# Patient Record
Sex: Male | Born: 1955 | Race: Black or African American | Hispanic: No | Marital: Married | Smoking: Never smoker
Health system: Southern US, Community
[De-identification: ages and names within clinical notes are randomized; demographics above are authoritative.]

## PROBLEM LIST (undated history)

## (undated) DIAGNOSIS — R5382 Chronic fatigue, unspecified: Secondary | ICD-10-CM

## (undated) DIAGNOSIS — D179 Benign lipomatous neoplasm, unspecified: Secondary | ICD-10-CM

## (undated) DIAGNOSIS — R030 Elevated blood-pressure reading, without diagnosis of hypertension: Secondary | ICD-10-CM

## (undated) DIAGNOSIS — K219 Gastro-esophageal reflux disease without esophagitis: Secondary | ICD-10-CM

## (undated) DIAGNOSIS — I10 Essential (primary) hypertension: Secondary | ICD-10-CM

## (undated) HISTORY — DX: Elevated blood-pressure reading, without diagnosis of hypertension: R03.0

## (undated) HISTORY — DX: Chronic fatigue, unspecified: R53.82

## (undated) HISTORY — DX: Gastro-esophageal reflux disease without esophagitis: K21.9

## (undated) HISTORY — DX: Essential (primary) hypertension: I10

## (undated) HISTORY — PX: NO PAST SURGERIES: SHX2092

## (undated) HISTORY — DX: Benign lipomatous neoplasm, unspecified: D17.9

---

## 2004-07-26 ENCOUNTER — Ambulatory Visit: Payer: Self-pay

## 2005-02-02 ENCOUNTER — Emergency Department: Payer: Self-pay | Admitting: General Practice

## 2006-01-31 ENCOUNTER — Ambulatory Visit: Payer: Self-pay | Admitting: Family Medicine

## 2007-03-04 DIAGNOSIS — G9332 Myalgic encephalomyelitis/chronic fatigue syndrome: Secondary | ICD-10-CM

## 2007-03-04 DIAGNOSIS — R5382 Chronic fatigue, unspecified: Secondary | ICD-10-CM

## 2007-03-04 HISTORY — DX: Chronic fatigue, unspecified: R53.82

## 2007-03-04 HISTORY — DX: Myalgic encephalomyelitis/chronic fatigue syndrome: G93.32

## 2007-03-24 DIAGNOSIS — M542 Cervicalgia: Secondary | ICD-10-CM | POA: Insufficient documentation

## 2007-03-28 ENCOUNTER — Ambulatory Visit: Payer: Self-pay | Admitting: Family Medicine

## 2007-06-18 DIAGNOSIS — D179 Benign lipomatous neoplasm, unspecified: Secondary | ICD-10-CM

## 2007-06-18 HISTORY — DX: Benign lipomatous neoplasm, unspecified: D17.9

## 2009-07-27 ENCOUNTER — Ambulatory Visit: Payer: Self-pay | Admitting: Family Medicine

## 2009-07-27 DIAGNOSIS — IMO0001 Reserved for inherently not codable concepts without codable children: Secondary | ICD-10-CM

## 2009-07-27 HISTORY — DX: Reserved for inherently not codable concepts without codable children: IMO0001

## 2010-04-04 ENCOUNTER — Ambulatory Visit: Payer: Self-pay | Admitting: Family Medicine

## 2010-08-23 ENCOUNTER — Ambulatory Visit: Payer: Self-pay | Admitting: Family Medicine

## 2014-09-25 ENCOUNTER — Ambulatory Visit: Payer: Self-pay | Admitting: Family Medicine

## 2015-02-07 ENCOUNTER — Ambulatory Visit: Payer: Self-pay | Admitting: Family Medicine

## 2015-03-21 ENCOUNTER — Ambulatory Visit (INDEPENDENT_AMBULATORY_CARE_PROVIDER_SITE_OTHER): Payer: 59 | Admitting: Family Medicine

## 2015-03-21 ENCOUNTER — Encounter (INDEPENDENT_AMBULATORY_CARE_PROVIDER_SITE_OTHER): Payer: Self-pay

## 2015-03-21 ENCOUNTER — Encounter: Payer: Self-pay | Admitting: Family Medicine

## 2015-03-21 VITALS — BP 130/84 | HR 93 | Temp 98.1°F | Resp 18 | Ht 69.0 in | Wt 203.1 lb

## 2015-03-21 DIAGNOSIS — M542 Cervicalgia: Secondary | ICD-10-CM | POA: Diagnosis not present

## 2015-03-21 DIAGNOSIS — M12811 Other specific arthropathies, not elsewhere classified, right shoulder: Secondary | ICD-10-CM | POA: Diagnosis not present

## 2015-03-21 DIAGNOSIS — F32A Depression, unspecified: Secondary | ICD-10-CM | POA: Insufficient documentation

## 2015-03-21 DIAGNOSIS — R519 Headache, unspecified: Secondary | ICD-10-CM

## 2015-03-21 DIAGNOSIS — D179 Benign lipomatous neoplasm, unspecified: Secondary | ICD-10-CM | POA: Diagnosis not present

## 2015-03-21 DIAGNOSIS — R42 Dizziness and giddiness: Secondary | ICD-10-CM | POA: Diagnosis not present

## 2015-03-21 DIAGNOSIS — F329 Major depressive disorder, single episode, unspecified: Secondary | ICD-10-CM | POA: Insufficient documentation

## 2015-03-21 DIAGNOSIS — M75101 Unspecified rotator cuff tear or rupture of right shoulder, not specified as traumatic: Secondary | ICD-10-CM

## 2015-03-21 DIAGNOSIS — G894 Chronic pain syndrome: Secondary | ICD-10-CM

## 2015-03-21 DIAGNOSIS — J309 Allergic rhinitis, unspecified: Secondary | ICD-10-CM | POA: Insufficient documentation

## 2015-03-21 DIAGNOSIS — R51 Headache: Secondary | ICD-10-CM | POA: Diagnosis not present

## 2015-03-21 DIAGNOSIS — R030 Elevated blood-pressure reading, without diagnosis of hypertension: Secondary | ICD-10-CM

## 2015-03-21 DIAGNOSIS — I10 Essential (primary) hypertension: Secondary | ICD-10-CM | POA: Insufficient documentation

## 2015-03-21 DIAGNOSIS — R5383 Other fatigue: Secondary | ICD-10-CM | POA: Insufficient documentation

## 2015-03-21 DIAGNOSIS — K219 Gastro-esophageal reflux disease without esophagitis: Secondary | ICD-10-CM | POA: Insufficient documentation

## 2015-03-21 MED ORDER — CYCLOBENZAPRINE HCL 5 MG PO TABS: 5.0000 mg | ORAL_TABLET | Freq: Three times a day (TID) | ORAL | Status: DC | PRN

## 2015-03-21 MED ORDER — PREGABALIN 75 MG PO CAPS: 75.0000 mg | ORAL_CAPSULE | Freq: Two times a day (BID) | ORAL | Status: DC

## 2015-03-21 NOTE — Progress Notes (Signed)
Name: Dylan Sloan   MRN: [id number]    DOB: [birth year]   Date:03/21/2015       Progress Note  Subjective  Chief Complaint  Chief Complaint  Patient presents with  . Shoulder Injury     right, was having a headache, got dizzy and fell. Surgery scheduled for 03/28/2015 in [place]  . 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 The right shoulder is affected. The incident occurred more than 1 week ago. The quality of the pain is described as aching and stabbing. The pain radiates to the right arm and right neck. The pain is moderate. Pertinent negatives include no chest pain or tingling. The symptoms are aggravated by movement and overhead lifting. He has tried acetaminophen, elevation, immobilization and NSAIDs for the symptoms. The treatment provided mild relief.  Headache  This is a chronic problem. The current episode started more than 1 year ago. The problem occurs constantly. The problem has been waxing and waning. The pain is located in the bilateral region. The pain radiates to the left shoulder and right shoulder. The pain quality is similar to prior headaches. The quality of the pain is described as throbbing and stabbing. The pain is severe. Associated symptoms include anorexia, dizziness, insomnia, a loss of balance, neck pain and weakness. Pertinent negatives include no back pain, blurred vision, coughing, eye redness, fever, hearing loss, nausea, seizures, sore throat, tingling, tinnitus, vomiting or weight loss. Associated symptoms comments: Syncopal episodes. The symptoms are aggravated by activity, fatigue, hunger, noise and weather changes. He has tried acetaminophen, antidepressants and NSAIDs for the symptoms. The treatment provided mild relief. His past medical history is significant for obesity.  (Elevated blood pressure without diagnosis of hypertension)   CNS lipoma  Patient is had a repeat MRI of the skull was recently 6 months ago. It is show no change in the size of the lipoma and there is no compression from this lesion beyond that was seen in 2008.  Elevated blood pressure without hypertension  Patient is had recurrent episodes of elevation of his blood pressure. On his last visit here he was experiencing pain was in the 671 range systolic. He states that on his emergency room visit [place] where he injured his shoulder his blood pressures in the 150 range. He is not expressing any chest pain or palpitations.   Past Medical History  Diagnosis Date  . GERD (gastroesophageal reflux disease)   . Elevated blood pressure 07/27/2009  . Lipoma 06/18/2007  . Chronic fatigue syndrome 03/04/2007    History  Substance Use Topics  . Smoking status: Never Smoker   . Smokeless tobacco: Never Used  . Alcohol Use: No     Current outpatient prescriptions:  .  traMADol (ULTRAM) 50 MG tablet, Take 50 mg by mouth every 6 (six) hours as needed., Disp: , Rfl:  .  cyclobenzaprine (FLEXERIL) 5 MG tablet, Take 5 mg by mouth 3 (three) times daily as needed for muscle spasms., Disp: , Rfl:  .  diclofenac (VOLTAREN) 75 MG EC tablet, Take 75 mg by mouth 2 (two) times daily., Disp: , Rfl:  .  pregabalin (LYRICA) 75 MG capsule, Take 75 mg by mouth 2 (two) times daily., Disp: , Rfl:   Allergies  Allergen Reactions  . Demerol [Meperidine] Other (See Comments)  Review of Systems  Constitutional: Negative for fever, chills and weight loss.  HENT: Negative for congestion, hearing loss, sore throat and tinnitus.   Eyes: Negative for blurred vision, double vision and redness.  Respiratory: Negative for cough, hemoptysis and shortness of breath.   Cardiovascular: Negative for chest pain, palpitations, orthopnea, claudication and leg swelling.  Gastrointestinal: Positive for anorexia. Negative  for heartburn, nausea, vomiting, diarrhea, constipation and blood in stool.  Genitourinary: Negative for dysuria, urgency, frequency and hematuria.  Musculoskeletal: Positive for joint pain and neck pain. Negative for myalgias, back pain and falls.       Right shoulder pain has rotator cuff tear from fall  Skin: Negative for itching.  Neurological: Positive for dizziness, sensory change, weakness, headaches and loss of balance. Negative for tingling, tremors, focal weakness, seizures and loss of consciousness.  Endo/Heme/Allergies: Does not bruise/bleed easily.  Psychiatric/Behavioral: Negative for depression and substance abuse. The patient has insomnia. The patient is not nervous/anxious.      Objective  Filed Vitals:   03/21/15 1408  BP: 130/84  Pulse: 93  Temp: 98.1 F (36.7 C)  TempSrc: Oral  Resp: 18  Height: 5\' 9"  (1.753 m)  Weight: 203 lb 1.6 oz (92.126 kg)  SpO2: 97%     Physical Exam  Constitutional: He is oriented to person, place, and time and well-developed, well-nourished, and in no distress.  HENT:  Head: Normocephalic.  Eyes: EOM are normal. Pupils are equal, round, and reactive to light.  Neck: Normal range of motion. Neck supple. No thyromegaly present.  Cardiovascular: Normal rate, regular rhythm and normal heart sounds.   No murmur heard. Pulmonary/Chest: Effort normal and breath sounds normal. No respiratory distress. He has no wheezes.  Abdominal: Soft. Bowel sounds are normal.  Musculoskeletal: He exhibits tenderness. He exhibits no edema.  Right shoulder markedly tender and he is unable to abduct beyond 90.  Lymphadenopathy:    He has no cervical adenopathy.  Neurological: He is alert and oriented to person, place, and time. No cranial nerve deficit. Gait normal. Coordination normal.  Skin: Skin is warm and dry. No rash noted.  Psychiatric: Mood, memory, affect and judgment normal.      Assessment & Plan  1. Intractable episodic headache,  unspecified headache type Relatively stable  2. Blood pressure elevated without history of HTN Currently controlled. DASH diet - EKG 12-Lead within normal limits  3. Rotator cuff tear arthropathy of right shoulder No contraindication to cervical repair at this time  4. Cervical pain Stable - cyclobenzaprine (FLEXERIL) 5 MG tablet; Take 1 tablet (5 mg total) by mouth 3 (three) times daily as needed for muscle spasms.  Dispense: 90 tablet; Refill: 5 - pregabalin (LYRICA) 75 MG capsule; Take 1 capsule (75 mg total) by mouth 2 (two) times daily.  Dispense: 60 capsule; Refill: 5  5. Dizziness and giddiness Stable intermittent exacerbation  6. Lipoma Clinically stable by MRI - pregabalin (LYRICA) 75 MG capsule; Take 1 capsule (75 mg total) by mouth 2 (two) times daily.  Dispense: 60 capsule; Refill: 5

## 2015-03-21 NOTE — Patient Instructions (Signed)

## 2015-09-21 ENCOUNTER — Ambulatory Visit (INDEPENDENT_AMBULATORY_CARE_PROVIDER_SITE_OTHER): Payer: 59 | Admitting: Family Medicine

## 2015-09-21 ENCOUNTER — Encounter: Payer: Self-pay | Admitting: Family Medicine

## 2015-09-21 VITALS — BP 142/88 | HR 98 | Temp 98.8°F | Resp 16 | Wt 212.6 lb

## 2015-09-21 DIAGNOSIS — Z23 Encounter for immunization: Secondary | ICD-10-CM | POA: Diagnosis not present

## 2015-09-21 DIAGNOSIS — Q040 Congenital malformations of corpus callosum: Secondary | ICD-10-CM | POA: Diagnosis not present

## 2015-09-21 DIAGNOSIS — R55 Syncope and collapse: Secondary | ICD-10-CM

## 2015-09-21 DIAGNOSIS — M542 Cervicalgia: Secondary | ICD-10-CM | POA: Diagnosis not present

## 2015-09-21 DIAGNOSIS — D1779 Benign lipomatous neoplasm of other sites: Secondary | ICD-10-CM

## 2015-09-21 DIAGNOSIS — I10 Essential (primary) hypertension: Secondary | ICD-10-CM

## 2015-09-21 MED ORDER — DICLOFENAC SODIUM 75 MG PO TBEC: 75.0000 mg | DELAYED_RELEASE_TABLET | Freq: Two times a day (BID) | ORAL | Status: AC

## 2015-09-21 MED ORDER — TRAMADOL HCL 50 MG PO TABS: 50.0000 mg | ORAL_TABLET | Freq: Four times a day (QID) | ORAL | Status: AC | PRN

## 2015-09-21 MED ORDER — HYDROCHLOROTHIAZIDE 12.5 MG PO TABS: 12.5000 mg | ORAL_TABLET | Freq: Every day | ORAL | Status: AC

## 2015-09-21 MED ORDER — PREGABALIN 75 MG PO CAPS: 75.0000 mg | ORAL_CAPSULE | Freq: Two times a day (BID) | ORAL | Status: DC

## 2015-09-21 MED ORDER — CYCLOBENZAPRINE HCL 5 MG PO TABS: 5.0000 mg | ORAL_TABLET | Freq: Every day | ORAL | Status: AC

## 2015-09-21 NOTE — Progress Notes (Signed)
Name: Dylan Sloan   MRN: [id number]    DOB: 1956-07-11   Date:09/21/2015       Progress Note  Subjective  Chief Complaint  Chief Complaint  Patient presents with  . Headache    patient is here for his 82-month follow-up. he stated that he still have dizziness and headaches.   . Lymphoma    is being monitored. had a MRI about 6 months ago.  . 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 He was diagnosed with a lipoma in his brain and also corpus callosum abnormality. He has on disability since. Last MRI was done one year ago. He has recurrent episodes of syncope and last episode was about one month ago ( he recalls what he saw prior to syncope, but unable to remember anything for about 10 minutes , no bowel or bladder incontinence with episode ). He has not seen his neurologist recently. He continues to take Lyrica, Voltaren and Flexeril daily . No side effects of medication. He continues to have daily nuchal pain, pain at this time is 4/10, occasionally has tingling on both hands. No weakness. He has daily headaches.   HTN: not checking bp, his bp has been elevated twice in our office. No chest pain, no palpitation.   Patient Active Problem List   Diagnosis Date Noted  . Allergic rhinitis 03/21/2015  . Clinical depression 03/21/2015  . Fatigue 03/21/2015  . Acid reflux 03/21/2015  . Hypertension, benign 03/21/2015  . Chronic pain syndrome 03/21/2015  . Rotator cuff tear arthropathy of right shoulder 03/21/2015  . 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 Dizziness and giddiness 07/17/2007  . Lipoma 06/18/2007  . Cervical pain 03/24/2007  . Cephalalgia 03/24/2007    Past Surgical History  Procedure Laterality Date  . No past surgeries      Family History  Problem Relation Age of Onset  . Breast cancer Mother     Social History   Social History  . Marital Status: Married     Spouse Name: N/A  . Number of Children: N/A  . Years of Education: N/A   Occupational History  . Not on file.   Social History Main Topics  . Smoking status: Never Smoker   . Smokeless tobacco: Never Used  . Alcohol Use: No  . Drug Use: No  . Sexual Activity:    Partners: Female   Other Topics Concern  . Not on file   Social History Narrative     Current outpatient prescriptions:  .  cyclobenzaprine (FLEXERIL) 5 MG tablet, Take 1 tablet (5 mg total) by mouth at bedtime., Disp: 90 tablet, Rfl: 1 .  diclofenac (VOLTAREN) 75 MG EC tablet, Take 1 tablet (75 mg total) by mouth 2 (two) times daily., Disp: 180 tablet, Rfl: 0 .  pregabalin (LYRICA) 75 MG capsule, Take 1 capsule (75 mg total) by mouth 2 (two) times daily., Disp: 180 capsule, Rfl: 1 .  traMADol (ULTRAM) 50 MG tablet, Take 1 tablet (50 mg total) by mouth every 6 (six) hours as needed., Disp: 90 tablet, Rfl: 0 .  hydrochlorothiazide (HYDRODIURIL) 12.5 MG tablet, Take 1 tablet (12.5 mg total) by mouth daily., Disp: 90 tablet, Rfl: 1  Allergies  Allergen Reactions  . Demerol [Meperidine] Other (See Comments)     ROS  Constitutional: Negative for fever or weight change.  Respiratory: Negative for cough and  shortness of breath.   Cardiovascular: Negative for chest pain or palpitations.  Gastrointestinal: Negative for abdominal pain, no bowel changes.  Musculoskeletal: Negative for gait problem or joint swelling.  Skin: Negative for rash.  Neurological: Negative for dizziness , positive for recurrent  headache. History of syncope in December No other specific complaints in a complete review of systems (except as listed in HPI above).  Objective  Filed Vitals:   09/21/15 1240  BP: 142/88  Pulse: 98  Temp: 98.8 F (37.1 C)  TempSrc: Oral  Resp: 16  Weight: 212 lb 9.6 oz (96.435 kg)  SpO2: 98%    Body mass index is 31.38 kg/(m^2).  Physical Exam  Constitutional: Patient appears well-developed and  well-nourished. No distress.  HEENT: head atraumatic, normocephalic, pupils equal and reactive to light,neck supple, throat within normal limits Cardiovascular: Normal rate, regular rhythm and normal heart sounds.  No murmur heard. No BLE edema. Pulmonary/Chest: Effort normal and breath sounds normal. No respiratory distress. Abdominal: Soft.  There is no tenderness. Psychiatric: Patient has a normal mood and affect. behavior is normal. Judgment and thought content normal. Neurological: normal strength, no nystagmus, normal rom of neck, normal cranial nerves  PHQ2/9: Depression screen PHQ 2/9 09/21/2015  Decreased Interest 0  Down, Depressed, Hopeless 0  PHQ - 2 Score 0    Fall Risk: Fall Risk  09/21/2015 03/21/2015  Falls in the past year? Yes Yes  Number falls in past yr: 2 or more 2 or more  Injury with Fall? Yes Yes  Risk Factor Category  - High Fall Risk  Risk for fall due to : - History of fall(s);Impaired balance/gait;Other (Comment)     Functional Status Survey: Is the patient deaf or have difficulty hearing?: No Does the patient have difficulty seeing, even when wearing glasses/contacts?: No Does the patient have difficulty concentrating, remembering, or making decisions?: No Does the patient have difficulty walking or climbing stairs?: No Does the patient have difficulty dressing or bathing?: No Does the patient have difficulty doing errands alone such as visiting a doctor's office or shopping?: No    Assessment & Plan   1. Hypertension, benign  - hydrochlorothiazide (HYDRODIURIL) 12.5 MG tablet; Take 1 tablet (12.5 mg total) by mouth daily.  Dispense: 90 tablet; Refill: 1 - Lipid panel - Comprehensive metabolic panel - CBC with Differential/Platelet - TSH - POCT UA - Microalbumin  2. Brain lipoma  - pregabalin (LYRICA) 75 MG capsule; Take 1 capsule (75 mg total) by mouth 2 (two) times daily.  Dispense: 180 capsule; Refill: 1 - Ambulatory referral to  Neurology  3. Cervical pain  - cyclobenzaprine (FLEXERIL) 5 MG tablet; Take 1 tablet (5 mg total) by mouth at bedtime.  Dispense: 90 tablet; Refill: 1 - pregabalin (LYRICA) 75 MG capsule; Take 1 capsule (75 mg total) by mouth 2 (two) times daily.  Dispense: 180 capsule; Refill: 1 - traMADol (ULTRAM) 50 MG tablet; Take 1 tablet (50 mg total) by mouth every 6 (six) hours as needed.  Dispense: 90 tablet; Refill: 0 - diclofenac (VOLTAREN) 75 MG EC tablet; Take 1 tablet (75 mg total) by mouth 2 (two) times daily.  Dispense: 180 tablet; Refill: 0  4. Syncope, unspecified syncope type  Same symptom when diagnosed in 2008, advised to follow up with Neurologist, but currently spending most of his time in [place] where his younger daughter is still there in rehabilitation ( lost oldest during [place]- but the youngest in still recovering - accident happened one year ago)  He will try to find a neurologist there and send [place] the reports.  - Ambulatory referral to Neurology  5. Need for Tdap vaccination  - Tdap vaccine greater than or equal to 7yo IM  6. 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

## 2015-09-22 ENCOUNTER — Ambulatory Visit: Payer: 59 | Admitting: Family Medicine

## 2015-09-23 LAB — TSH: TSH: 1.42 u[IU]/mL (ref 0.450–4.500)

## 2015-09-23 LAB — COMPREHENSIVE METABOLIC PANEL
A/G RATIO: 1.7 (ref 1.1–2.5)
ALBUMIN: 4.3 g/dL (ref 3.5–5.5)
ALK PHOS: 94 IU/L (ref 39–117)
ALT: 46 IU/L — ABNORMAL HIGH (ref 0–44)
AST: 33 IU/L (ref 0–40)
BUN / CREAT RATIO: 11 (ref 9–20)
BUN: 13 mg/dL (ref 6–24)
Bilirubin Total: 1 mg/dL (ref 0.0–1.2)
CO2: 22 mmol/L (ref 18–29)
CREATININE: 1.18 mg/dL (ref 0.76–1.27)
Calcium: 9.5 mg/dL (ref 8.7–10.2)
Chloride: 102 mmol/L (ref 96–106)
GFR calc Af Amer: 79 mL/min/{1.73_m2} (ref >59)
GFR, EST NON AFRICAN AMERICAN: 68 mL/min/{1.73_m2} (ref >59)
GLOBULIN, TOTAL: 2.6 g/dL (ref 1.5–4.5)
Glucose: 109 mg/dL — ABNORMAL HIGH (ref 65–99)
POTASSIUM: 4.2 mmol/L (ref 3.5–5.2)
SODIUM: 141 mmol/L (ref 134–144)
Total Protein: 6.9 g/dL (ref 6.0–8.5)

## 2015-09-23 LAB — CBC WITH DIFFERENTIAL/PLATELET
BASOS: 0 %
Basophils Absolute: 0 10*3/uL (ref 0.0–0.2)
EOS (ABSOLUTE): 0.1 10*3/uL (ref 0.0–0.4)
EOS: 2 %
HEMATOCRIT: 43.6 % (ref 37.5–51.0)
Hemoglobin: 14.4 g/dL (ref 12.6–17.7)
Immature Grans (Abs): 0 10*3/uL (ref 0.0–0.1)
Immature Granulocytes: 0 %
LYMPHS ABS: 1 10*3/uL (ref 0.7–3.1)
Lymphs: 26 %
MCH: 27.2 pg (ref 26.6–33.0)
MCHC: 33 g/dL (ref 31.5–35.7)
MCV: 82 fL (ref 79–97)
MONOS ABS: 0.5 10*3/uL (ref 0.1–0.9)
Monocytes: 14 %
Neutrophils Absolute: 2.3 10*3/uL (ref 1.4–7.0)
Neutrophils: 58 %
PLATELETS: 230 10*3/uL (ref 150–379)
RBC: 5.3 x10E6/uL (ref 4.14–5.80)
RDW: 13.8 % (ref 12.3–15.4)
WBC: 3.8 10*3/uL (ref 3.4–10.8)

## 2015-09-23 LAB — LIPID PANEL
CHOL/HDL RATIO: 3.1 ratio (ref 0.0–5.0)
Cholesterol, Total: 174 mg/dL (ref 100–199)
HDL: 56 mg/dL (ref >39)
LDL CALC: 91 mg/dL (ref 0–99)
Triglycerides: 135 mg/dL (ref 0–149)
VLDL Cholesterol Cal: 27 mg/dL (ref 5–40)

## 2015-09-25 ENCOUNTER — Other Ambulatory Visit: Payer: Self-pay | Admitting: Family Medicine

## 2015-09-25 DIAGNOSIS — R739 Hyperglycemia, unspecified: Secondary | ICD-10-CM

## 2015-09-26 NOTE — Progress Notes (Signed)
[place] and added A1C to his existing blood work.

## 2015-10-18 LAB — HGB A1C W/O EAG: Hgb A1c MFr Bld: 6.1 % — ABNORMAL HIGH (ref 4.8–5.6)

## 2015-10-18 LAB — SPECIMEN STATUS REPORT

## 2015-10-28 ENCOUNTER — Ambulatory Visit (INDEPENDENT_AMBULATORY_CARE_PROVIDER_SITE_OTHER): Payer: 59 | Admitting: Neurology

## 2015-10-28 ENCOUNTER — Encounter: Payer: Self-pay | Admitting: Neurology

## 2015-10-28 VITALS — BP 148/90 | HR 88 | Ht 69.0 in | Wt 212.0 lb

## 2015-10-28 DIAGNOSIS — Q040 Congenital malformations of corpus callosum: Secondary | ICD-10-CM | POA: Insufficient documentation

## 2015-10-28 DIAGNOSIS — M542 Cervicalgia: Secondary | ICD-10-CM | POA: Insufficient documentation

## 2015-10-28 DIAGNOSIS — G450 Vertebro-basilar artery syndrome: Secondary | ICD-10-CM | POA: Insufficient documentation

## 2015-10-28 DIAGNOSIS — R55 Syncope and collapse: Secondary | ICD-10-CM

## 2015-10-28 DIAGNOSIS — D179 Benign lipomatous neoplasm, unspecified: Secondary | ICD-10-CM | POA: Insufficient documentation

## 2015-10-28 DIAGNOSIS — G43109 Migraine with aura, not intractable, without status migrainosus: Secondary | ICD-10-CM | POA: Insufficient documentation

## 2015-10-28 DIAGNOSIS — R51 Headache: Secondary | ICD-10-CM

## 2015-10-28 DIAGNOSIS — R519 Headache, unspecified: Secondary | ICD-10-CM

## 2015-10-28 MED ORDER — PREGABALIN 150 MG PO CAPS: ORAL_CAPSULE | ORAL | Status: DC

## 2015-10-28 NOTE — Patient Instructions (Signed)
1. Schedule MRI brain with and without contrast 2. Schedule MRA head without contrast 3. Schedule routine EEG 4. Refer to Physical Therapy for neck pain 5. Increase Lyrica to 150mg  twice a day 6. Minimize intake of Tramadol to avoid rebound headaches 7. As per [place] driving laws, no driving after an episode of loss of consciousness until 6 months event-free

## 2015-10-28 NOTE — Progress Notes (Signed)
NEUROLOGY CONSULTATION NOTE  Dylan Sloan MRN: [id number] DOB: [birth year]  Referring provider: Dr. Steele Sizer Primary care provider: Dr. Ashok Norris  Reason for consult:  Syncope, headaches  Dear Dr Rutherford Nail:  Thank you for your kind referral of Dylan Sloan for consultation of the above symptoms. Although his history is well known to you, please allow me to reiterate it for the purpose of our medical record. The patient was accompanied to the clinic by his wife who also provides collateral information. Records and images were personally reviewed where available.  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  He reports that syncope and headaches started in 2008, and at that time he had the abnormal MRI brain and had seen neurosurgeon Dr. Tommi Rumps at [facility]. He is not a surgical candidate due to location of lipoma. He reports that syncopal episodes mostly occur without warning, but over the past few months have been increasing in frequency. He reports a syncopal episode in February while he was singing, then again last 10/23/15. He started having a bad headache, followed by dizziness (spinning sensation), then lost consciousness for a few minutes leading to a fall. No injuries, no convulsive activity, tongue bite or incontinence. He denies any olfactory/gustatory hallucinations, deja vu, rising epigastric sensation, focal numbness/tingling/weakness, myoclonic jerks. He reports that syncopal episodes are always preceded by worsening headaches. He reports headaches on a daily basis since 2008, initially waxing and waning in intensity, however for the past month, intensity has constantly been a 7.5 to 8 over 10. He has a stabbing pain in the occipital region, photosensitivity, no  associated nausea/vomiting. He had been prescribed Lyrica 75mg  BID since 2009 for the headaches, no side effects. He has not been on higher doses. He takes Motrin sparingly, but takes Tramadol BID for various aches and pains. Diclofenac and Flexeril are on his medication list, but he does not take them, reporting they were not helpful.  He denies any prior history of headaches before 2008. No family history of headaches. He reports vision gets blurred with worsening headaches. He has chronic neck pain, no back pain or bowel/bladder dysfunction. He denies any dysarthria/dysphagia. He and his wife deny any staring/unresponsive episodes or gaps in time. He had a normal birth and early development.  There is no history of febrile convulsions, CNS infections such as meningitis/encephalitis, significant traumatic brain injury, neurosurgical procedures, or family history of seizures. He reports having 24-hour heart monitor that was normal.   Images unavailable for review, MRI report from 09/25/14 indicate a large congenital lipoma wrapping around the entire corpus callosum. The corpus callosum is small and somewhat dysplastic. Foci of lipoma are also present associated with the choroid in the atrium and posterior bodies of the lateral ventricles bilaterally. As expected, there is no change going as far back as 2008. No hydrocephalus. No extra-axial fluid collection. No neoplastic mass lesion. After contrast administration, no abnormal enhancement occurs.  Laboratory Data:  Lab Results  Component Value Date   WBC 3.8 09/22/2015   HCT 43.6 09/22/2015   MCV 82 09/22/2015   PLT 230 09/22/2015     Chemistry      Component Value Date/Time   NA 141 09/22/2015 0959   K 4.2 09/22/2015 0959   CL 102 09/22/2015 0959   CO2 22 09/22/2015 0959  BUN 13 09/22/2015 0959   CREATININE 1.18 09/22/2015 0959      Component Value Date/Time   CALCIUM 9.5 09/22/2015 0959   ALKPHOS 94 09/22/2015 0959   AST 33  09/22/2015 0959   ALT 46* 09/22/2015 0959   BILITOT 1.0 09/22/2015 0959      PAST MEDICAL HISTORY: Past Medical History  Diagnosis Date  . GERD (gastroesophageal reflux disease)   . Elevated blood pressure 07/27/2009  . Lipoma 06/18/2007  . Chronic fatigue syndrome 03/04/2007    PAST SURGICAL HISTORY: Past Surgical History  Procedure Laterality Date  . No past surgeries      MEDICATIONS: Current Outpatient Prescriptions on File Prior to Visit  Medication Sig Dispense Refill  . cyclobenzaprine (FLEXERIL) 5 MG tablet Take 1 tablet (5 mg total) by mouth at bedtime. 90 tablet 1  . diclofenac (VOLTAREN) 75 MG EC tablet Take 1 tablet (75 mg total) by mouth 2 (two) times daily. 180 tablet 0  . hydrochlorothiazide (HYDRODIURIL) 12.5 MG tablet Take 1 tablet (12.5 mg total) by mouth daily. 90 tablet 1  . pregabalin (LYRICA) 75 MG capsule Take 1 capsule (75 mg total) by mouth 2 (two) times daily. 180 capsule 1  . traMADol (ULTRAM) 50 MG tablet Take 1 tablet (50 mg total) by mouth every 6 (six) hours as needed. 90 tablet 0   No current facility-administered medications on file prior to visit.    ALLERGIES: Allergies  Allergen Reactions  . Demerol [Meperidine] Other (See Comments)    FAMILY HISTORY: Family History  Problem Relation Age of Onset  . Breast cancer Mother     SOCIAL HISTORY: Social History   Social History  . Marital Status: Married    Spouse Name: N/A  . Number of Children: N/A  . Years of Education: N/A   Occupational History  . Not on file.   Social History Main Topics  . Smoking status: Never Smoker   . Smokeless tobacco: Never Used  . Alcohol Use: No  . Drug Use: No  . Sexual Activity:    Partners: Female   Other Topics Concern  . Not on file   Social History Narrative    REVIEW OF SYSTEMS: Constitutional: No fevers, chills, or sweats, no generalized fatigue, change in appetite Eyes: No visual changes, double vision, eye pain Ear, nose  and throat: No hearing loss, ear pain, nasal congestion, sore throat Cardiovascular: No chest pain, palpitations Respiratory:  No shortness of breath at rest or with exertion, wheezes GastrointestinaI: No nausea, vomiting, diarrhea, abdominal pain, fecal incontinence Genitourinary:  No dysuria, urinary retention or frequency Musculoskeletal:  + neck pain, no back pain Integumentary: No rash, pruritus, skin lesions Neurological: as above Psychiatric: No depression, insomnia, anxiety Endocrine: No palpitations, fatigue, diaphoresis, mood swings, change in appetite, change in weight, increased thirst Hematologic/Lymphatic:  No anemia, purpura, petechiae. Allergic/Immunologic: no itchy/runny eyes, nasal congestion, recent allergic reactions, rashes  PHYSICAL EXAM: Filed Vitals:   10/28/15 1258  BP: 148/90  Pulse: 88   General: No acute distress Head:  Normocephalic/atraumatic Eyes: Fundoscopic exam shows bilateral sharp discs, no vessel changes, exudates, or hemorrhages Neck: supple, no paraspinal tenderness, full range of motion Back: No paraspinal tenderness Heart: regular rate and rhythm Lungs: Clear to auscultation bilaterally. Vascular: No carotid bruits. Skin/Extremities: No rash, no edema Neurological Exam: Mental status: alert and oriented to person, place, and time, no dysarthria or aphasia, Fund of knowledge is appropriate.  Remote memory intact. 1/3 delayed recall. Attention and concentration are  normal.    Able to name objects and repeat phrases. Cranial nerves: CN I: not tested CN II: pupils equal, round and reactive to light, visual fields intact, fundi unremarkable. CN III, IV, VI:  full range of motion, no nystagmus, no ptosis CN V: facial sensation intact CN VII: upper and lower face symmetric CN VIII: hearing intact to finger rub CN IX, X: gag intact, uvula midline CN XI: sternocleidomastoid and trapezius muscles intact CN XII: tongue midline Bulk & Tone: normal,  no fasciculations. Motor: 5/5 throughout with no pronator drift. Sensation: intact to light touch, cold, pin, vibration and joint position sense.  No extinction to double simultaneous stimulation.  Romberg test negative Deep Tendon Reflexes: +2 throughout, no ankle clonus Plantar responses: downgoing bilaterally Cerebellar: no incoordination on finger to nose, heel to shin. No dysdiadochokinesia Gait: narrow-based and steady, able to tandem walk adequately. 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 Cardiac workup negative per patient. Syncopal episodes are preceded by worsening of headaches, raising the possibility of basilar migraines. A repeat MRI brain with and without contrast, as well as MRA head without contrast will be assessed for interval change and vertebrobasilar insufficiency. We discussed neck pain and contribution to headaches as well, he is agreeable to starting physical therapy for neck pain. Seizures are less likely cause of syncopal episodes, a routine EEG will be ordered. He will increase Lyrica to 150mg  BID for headache prophylaxis. He was advised to reduce Tramadol intake, there may be a component of medication overuse headaches as well. [place] driving laws were discussed with the patient, and he knows to stop driving after an episode of loss of consciousness, until 6 months event-free. He will follow-up after the tests.   Thank you for allowing me to participate in the care of this patient. Please do not hesitate to call for any questions or concerns.   Ellouise Newer, M.D.  CC: Dr. Ancil Boozer

## 2015-11-17 ENCOUNTER — Telehealth: Payer: Self-pay | Admitting: Neurology

## 2015-11-17 MED ORDER — DIAZEPAM 5 MG PO TABS: ORAL_TABLET | ORAL | Status: DC

## 2015-11-17 NOTE — Telephone Encounter (Signed)
Called cell phone listed, patient's wife answered. He is unable to take call when I had called, discussed with wife Lyrica insurance issues, which they are aware of. Asked if he had taken gabapentin/Neurontin in the past, she is unsure and will have him call us back regarding this question.  He is asking for a medication to help with MRI for Tues. Tiff, pls send in Rx for Valium for MRI to his pharmacy. Thanks!!

## 2015-11-22 ENCOUNTER — Ambulatory Visit
Admission: RE | Admit: 2015-11-22 | Discharge: 2015-11-22 | Disposition: A | Discharge status: Home / Self Care | Payer: 59 | Source: Ambulatory Visit | Attending: Neurology | Admitting: Neurology

## 2015-11-22 ENCOUNTER — Ambulatory Visit: Admission: RE | Admit: 2015-11-22 | Payer: 59 | Source: Ambulatory Visit

## 2015-11-22 DIAGNOSIS — Q040 Congenital malformations of corpus callosum: Secondary | ICD-10-CM | POA: Diagnosis not present

## 2015-11-22 DIAGNOSIS — R51 Headache: Secondary | ICD-10-CM | POA: Insufficient documentation

## 2015-11-22 DIAGNOSIS — R9089 Other abnormal findings on diagnostic imaging of central nervous system: Secondary | ICD-10-CM | POA: Insufficient documentation

## 2015-11-22 DIAGNOSIS — I672 Cerebral atherosclerosis: Secondary | ICD-10-CM | POA: Insufficient documentation

## 2015-11-22 DIAGNOSIS — R55 Syncope and collapse: Secondary | ICD-10-CM | POA: Insufficient documentation

## 2015-11-22 DIAGNOSIS — D179 Benign lipomatous neoplasm, unspecified: Secondary | ICD-10-CM | POA: Diagnosis present

## 2015-11-22 MED ORDER — GADOBENATE DIMEGLUMINE 529 MG/ML IV SOLN
20.0000 mL | Freq: Once | INTRAVENOUS | Status: AC | PRN
  Administered 2015-11-22: 20 mL via INTRAVENOUS

## 2015-11-23 ENCOUNTER — Ambulatory Visit (INDEPENDENT_AMBULATORY_CARE_PROVIDER_SITE_OTHER): Payer: 59 | Admitting: Neurology

## 2015-11-23 DIAGNOSIS — R55 Syncope and collapse: Secondary | ICD-10-CM | POA: Diagnosis not present

## 2015-11-30 ENCOUNTER — Encounter: Payer: Self-pay | Admitting: Neurology

## 2015-11-30 ENCOUNTER — Ambulatory Visit (INDEPENDENT_AMBULATORY_CARE_PROVIDER_SITE_OTHER): Payer: 59 | Admitting: Neurology

## 2015-11-30 VITALS — BP 126/90 | HR 100 | Ht 69.0 in | Wt 211.0 lb

## 2015-11-30 DIAGNOSIS — Q040 Congenital malformations of corpus callosum: Secondary | ICD-10-CM

## 2015-11-30 DIAGNOSIS — R55 Syncope and collapse: Secondary | ICD-10-CM | POA: Diagnosis not present

## 2015-11-30 DIAGNOSIS — R51 Headache: Secondary | ICD-10-CM | POA: Diagnosis not present

## 2015-11-30 DIAGNOSIS — R519 Headache, unspecified: Secondary | ICD-10-CM

## 2015-11-30 DIAGNOSIS — D179 Benign lipomatous neoplasm, unspecified: Secondary | ICD-10-CM | POA: Diagnosis not present

## 2015-11-30 MED ORDER — GABAPENTIN 300 MG PO CAPS: ORAL_CAPSULE | ORAL | Status: AC

## 2015-11-30 NOTE — Patient Instructions (Addendum)
1. Start Gabapentin 300mg : Take 1 capsule at night for 1 week, then increase to 1 capsule twice a day 2. Call our office in 3-4 weeks to give [place] an update 3. Minimize Tylenol/Advil/Tramadol intake to 2-3 times a week 4. [place] laws, no driving after an episode of loss of consciousness until 6 months event-free 5. Follow-up in 5 months, call for any problems

## 2015-11-30 NOTE — Progress Notes (Signed)
NEUROLOGY FOLLOW UP OFFICE NOTE  [place] [id number]  HISTORY OF PRESENT ILLNESS: I had the pleasure of seeing Dylan Sloan in follow-up in the neurology clinic on 11/30/2015.  The patient was last seen a month ago for worsening headaches and a syncopal episode in February preceded by a bad headache. He is again accompanied by his wife who helps supplement the history today.  Records and images were personally reviewed where available.  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 MRA head was ordered to assess for vertebrobasilar insufficiency, there was mild intracranial atherosclerosis with at most mild ICA narrowing, no significant posterior circulation stenosis. There was note of focal 12mm laterally directed outpouching from the anterior cavernous carotid on the left and 42mm superiorly directed outpouching from the proximal left M1 segment read as likely very small aneurysms.  Since his last visit, he denies any syncopal episodes since 10/23/15. His routine EEG was normal, with note of abnormal EKG. He reports having a cardiac evaluation in the past. He continues to have frequent headaches. He had been taking Lyrica 75mg  BID since 2009 for the headaches, however when higher dose was sent to the pharmacy, his insurance company would not approve medication and he has been off this for a month. He denies any worsening of headaches off Lyrica. He does state they are not on a daily basis anymore, no headache today. Vision is occasionally blurred. He denies any nausea, vomiting, photo/phonophobia, dizziness, focal numbness/tingling/weakness.   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 He reports that syncope and  headaches started in 2008, and at that time he had the abnormal MRI brain and had seen neurosurgeon Dr. Tommi Rumps at [facility]. He is not a surgical candidate due to location of lipoma. He reports that syncopal episodes mostly occur without warning, but over the past few months have been increasing in frequency. He reports a syncopal episode in February while he was singing, then again last 10/23/15. He started having a bad headache, followed by dizziness (spinning sensation), then lost consciousness for a few minutes leading to a fall. No injuries, no convulsive activity, tongue bite or incontinence. He denies any olfactory/gustatory hallucinations, deja vu, rising epigastric sensation, focal numbness/tingling/weakness, myoclonic jerks. He reports that syncopal episodes are always preceded by worsening headaches. He reports headaches on a daily basis since 2008, initially waxing and waning in intensity, however for the past month, intensity has constantly been a 7.5 to 8 over 10. He has a stabbing pain in the occipital region, photosensitivity, no associated nausea/vomiting. He had been prescribed Lyrica 75mg  BID since 2009 for the headaches, no side effects. He has not been on higher doses. He takes Motrin sparingly, but takes Tramadol BID for various aches and pains. Diclofenac and Flexeril are on his medication list, but he does not take them, reporting they were not helpful.  He denies any prior history of headaches before 2008. No family history of headaches. He reports vision gets blurred with worsening headaches. He has chronic neck pain, no back pain or bowel/bladder dysfunction. He denies any dysarthria/dysphagia. He and his wife deny any staring/unresponsive episodes or gaps in time. He had a normal birth and early development. There is no history of febrile convulsions, CNS infections such  as meningitis/encephalitis, significant traumatic brain injury, neurosurgical procedures, or family history of seizures.  He reports having 24-hour heart monitor that was normal.   Images unavailable for review, MRI report from 09/25/14 indicate a large congenital lipoma wrapping around the entire corpus callosum. The corpus callosum is small and somewhat dysplastic. Foci of lipoma are also present associated with the choroid in the atrium and posterior bodies of the lateral ventricles bilaterally. As expected, there is no change going as far back as 2008. No hydrocephalus. No extra-axial fluid collection. No neoplastic mass lesion. After contrast administration, no abnormal enhancement occurs.  PAST MEDICAL HISTORY: Past Medical History  Diagnosis Date  . GERD (gastroesophageal reflux disease)   . Elevated blood pressure 07/27/2009  . Lipoma 06/18/2007  . Chronic fatigue syndrome 03/04/2007  . Hypertension     MEDICATIONS: Current Outpatient Prescriptions on File Prior to Visit  Medication Sig Dispense Refill  . cyclobenzaprine (FLEXERIL) 5 MG tablet Take 1 tablet (5 mg total) by mouth at bedtime. 90 tablet 1  . diclofenac (VOLTAREN) 75 MG EC tablet Take 1 tablet (75 mg total) by mouth 2 (two) times daily. 180 tablet 0  . hydrochlorothiazide (HYDRODIURIL) 12.5 MG tablet Take 1 tablet (12.5 mg total) by mouth daily. 90 tablet 1  . traMADol (ULTRAM) 50 MG tablet Take 1 tablet (50 mg total) by mouth every 6 (six) hours as needed. 90 tablet 0   No current facility-administered medications on file prior to visit.    ALLERGIES: Allergies  Allergen Reactions  . Demerol [Meperidine] Other (See Comments)    FAMILY HISTORY: Family History  Problem Relation Age of Onset  . Breast cancer Mother     SOCIAL HISTORY: Social History   Social History  . Marital Status: Married    Spouse Name: N/A  . Number of Children: N/A  . Years of Education: N/A   Occupational History  . Not on file.   Social History Main Topics  . Smoking status: Never Smoker   . Smokeless tobacco: Never Used  . Alcohol Use:  No  . Drug Use: No  . Sexual Activity:    Partners: Female   Other Topics Concern  . Not on file   Social History Narrative    REVIEW OF SYSTEMS: Constitutional: No fevers, chills, or sweats, no generalized fatigue, change in appetite Eyes: No visual changes, double vision, eye pain Ear, nose and throat: No hearing loss, ear pain, nasal congestion, sore throat Cardiovascular: No chest pain, palpitations Respiratory:  No shortness of breath at rest or with exertion, wheezes GastrointestinaI: No nausea, vomiting, diarrhea, abdominal pain, fecal incontinence Genitourinary:  No dysuria, urinary retention or frequency Musculoskeletal:  No neck pain, back pain Integumentary: No rash, pruritus, skin lesions Neurological: as above Psychiatric: No depression, insomnia, anxiety Endocrine: No palpitations, fatigue, diaphoresis, mood swings, change in appetite, change in weight, increased thirst Hematologic/Lymphatic:  No anemia, purpura, petechiae. Allergic/Immunologic: no itchy/runny eyes, nasal congestion, recent allergic reactions, rashes  PHYSICAL EXAM: Filed Vitals:   11/30/15 0929  BP: 126/90  Pulse: 100   General: No acute distress Head:  Normocephalic/atraumatic Neck: supple, no paraspinal tenderness, full range of motion Heart:  Regular rate and rhythm Lungs:  Clear to auscultation bilaterally Back: No paraspinal tenderness Skin/Extremities: No rash, no edema Neurological Exam: alert and oriented to person, place, and time. No aphasia or dysarthria. Fund of knowledge is appropriate.  Recent and remote memory are intact.  Attention and concentration are normal.    Able to  name objects and repeat phrases. Cranial nerves: Pupils equal, round, reactive to light.  Fundoscopic exam unremarkable, no papilledema. Extraocular movements intact with no nystagmus. Visual fields full. Facial sensation intact. No facial asymmetry. Tongue, uvula, palate midline.  Motor: Bulk and tone normal,  muscle strength 5/5 throughout with no pronator drift.  Sensation to light touch. No extinction to double simultaneous stimulation.  Deep tendon reflexes 2+ throughout, toes downgoing.  Finger to nose testing intact.  Gait narrow-based and steady, able to tandem walk adequately.  Romberg negative.  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 Syncopal episodes are preceded by worsening of headaches, raising the possibility of basilar migraines. MRI brain and MRA head showed unchanged pericallosal lipoma. There was note of 2 very small aneurysms, we will continue to monitor this annually. No significant posterior circulation compromise seen. His EEG was normal, with note of frequent extrasystolic beats on EKG lead. He reports cardiac workup negative. Lyrica now not approved by his insurance, he will start gabapentin 300mg  qhs x 1 week, then increase to 300mg  BID. Side effects were discussed, we will plan to uptitrate further in the future, he will call our office in a month to update on his condition. He will keep a calendar of his symptoms. We again discussed [place] driving laws to stop driving after an episode of loss of consciousness, until 6 months event-free. He will follow-up in 5-6 months and knows to call for any problems.   Thank you for allowing me to participate in his care.  Please do not hesitate to call for any questions or concerns.  The duration of this appointment visit was 25 minutes of face-to-face time with the patient.  Greater than 50% of this time was spent in counseling, explanation of diagnosis, planning of further management, and coordination of care.   Ellouise Newer, M.D.   CC: Dr. Rutherford Nail

## 2015-11-30 NOTE — Procedures (Signed)
ELECTROENCEPHALOGRAM REPORT  Date of Study: 11/23/2015  Patient's Name: Dylan Sloan MRN: [id number] Date of Birth: Sep 23, 1955  Referring Provider: Dr. Ellouise Newer  Clinical History: This is a 60 year old man with a history of agenesis of the corpus callosum and midline lipoma, with an episode of loss of consciousness in February.  Medications: Lyrica, Ultram, Voltaren, Flexeril, HCTZ  Technical Summary: A multichannel digital EEG recording measured by the international 10-20 system with electrodes applied with paste and impedances below 5000 ohms performed in our laboratory with EKG monitoring in an awake and asleep patient.  Hyperventilation and photic stimulation were performed.  The digital EEG was referentially recorded, reformatted, and digitally filtered in a variety of bipolar and referential montages for optimal display.    Description: The patient is awake and asleep during the recording.  During maximal wakefulness, there is a symmetric, medium voltage 10 Hz posterior dominant rhythm that attenuates with eye opening.  The record is symmetric.  During drowsiness and sleep, there is an increase in theta slowing of the background.  Vertex waves and symmetric sleep spindles were seen.  Hyperventilation and photic stimulation did not elicit any abnormalities.  There were no epileptiform discharges or electrographic seizures seen.    EKG lead showed frequent extrasystolic beats.  Impression: This awake and asleep EEG is normal.  Note of abnormal 1-lead EKG findings.  Clinical Correlation: A normal EEG does not exclude a clinical diagnosis of epilepsy.  If further clinical questions remain, prolonged EEG may be helpful.  Clinical correlation is advised.   Ellouise Newer, M.D.

## 2016-05-01 ENCOUNTER — Ambulatory Visit: Payer: 59 | Admitting: Neurology

## 2016-05-01 DIAGNOSIS — Z029 Encounter for administrative examinations, unspecified: Secondary | ICD-10-CM

## 2017-01-24 IMAGING — MR MR HEAD WO/W CM
13 of 14 series · 37 of 48 positions shown · IV contrast (20 ML MULTIHANCE)
Comparison: Brain MRI 09/25/2014. No prior angiographic imaging
available.

CLINICAL DATA: Worsening headaches over the past 3 months. Syncope.
Intracranial lipoma. Assessment for vertebrobasilar insufficiency.

EXAM:
MRI HEAD WITHOUT AND WITH CONTRAST
MRA HEAD WITHOUT CONTRAST
TECHNIQUE: Multiplanar, multiecho pulse sequences of the brain and surrounding
structures were obtained without and with intravenous contrast.
Angiographic images of the head were obtained using MRA technique
without contrast.
CONTRAST:  20mL MULTIHANCE GADOBENATE DIMEGLUMINE 529 MG/ML IV SOLN

[Series 2: T1 · sagittal · 5.0mm · 0.45mm/px · 3 of 25 slices shown (1 of 2)]
[im 1/25]
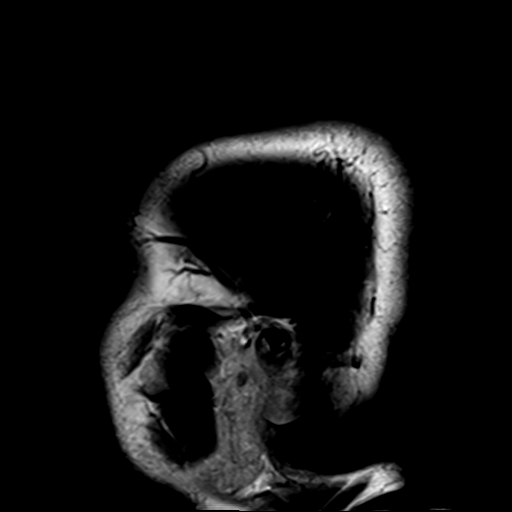
[im 13/25]
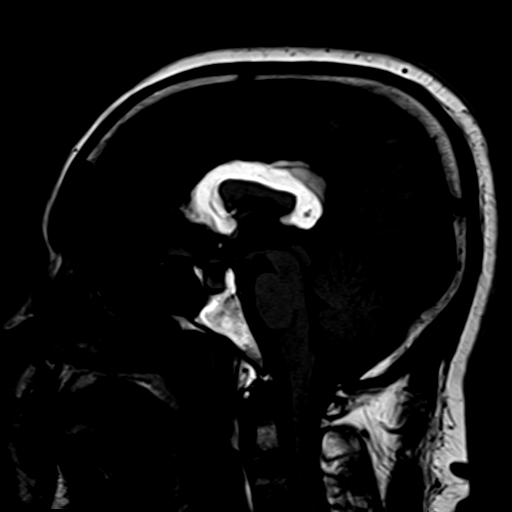
[im 25/25]
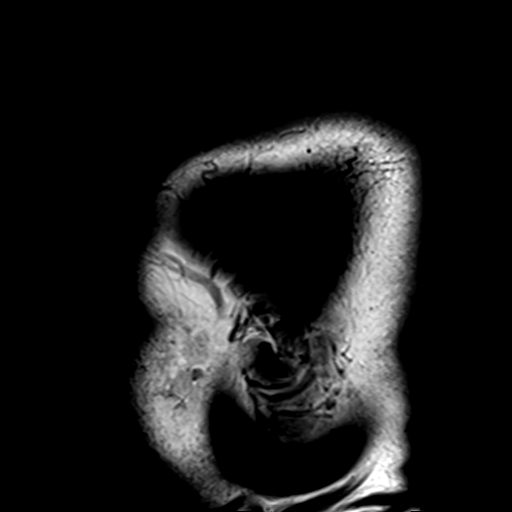

[Series 4: DWI · axial · 3.0mm · 1.80mm/px · z∈[-48,+113]mm · 4 of 55 slices shown (1 of 3)]
[im 1/55]
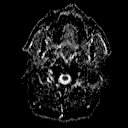
[im 19/55]
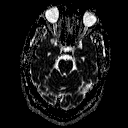
[im 37/55]
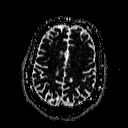
[im 55/55]
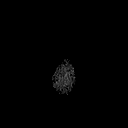

[Series 6: DWI · coronal · 3.0mm · 1.80mm/px · 3 of 45 slices shown (2 of 3)]
[im 1/45]
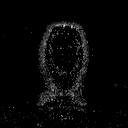
[im 23/45]
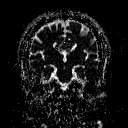
[im 45/45]
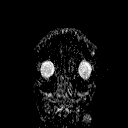

[Series 7: T2 · axial · 5.0mm · 0.60mm/px · z∈[-49,+106]mm · 2 of 25 slices shown (1 of 2)]
[im 1/25]
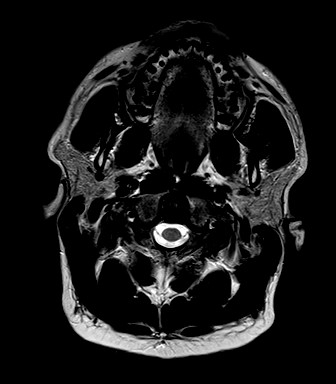
[im 25/25]
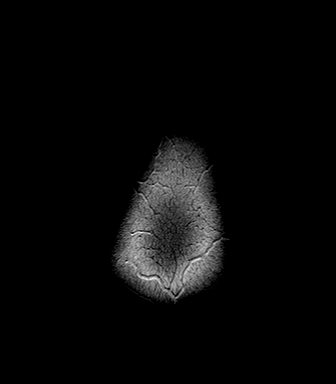

[Series 8: FLAIR · axial · 5.0mm · 0.45mm/px · z∈[-49,+106]mm · 2 of 25 slices shown]
[im 1/25]
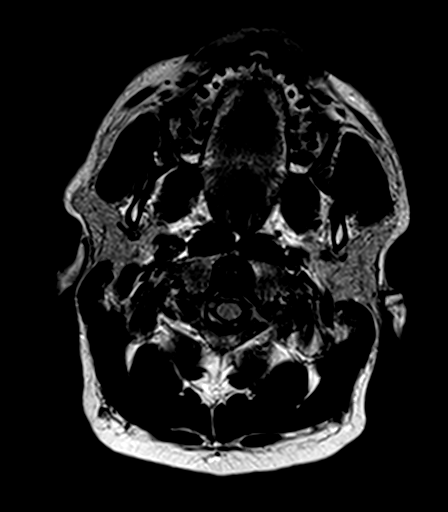
[im 25/25]
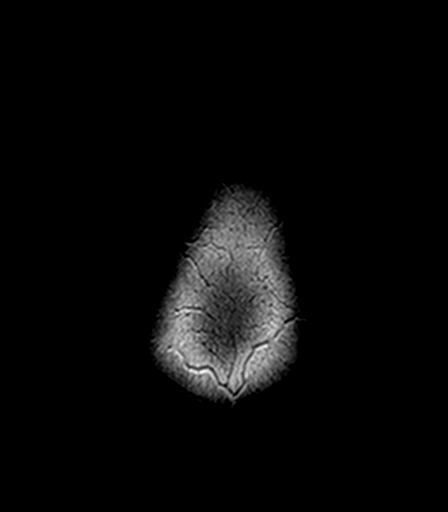

[Series 9: TOF · axial · non-contrast · 0.7mm · 0.37mm/px · z∈[-44,-15]mm · 3 of 143 slices shown]
[im 1/143]
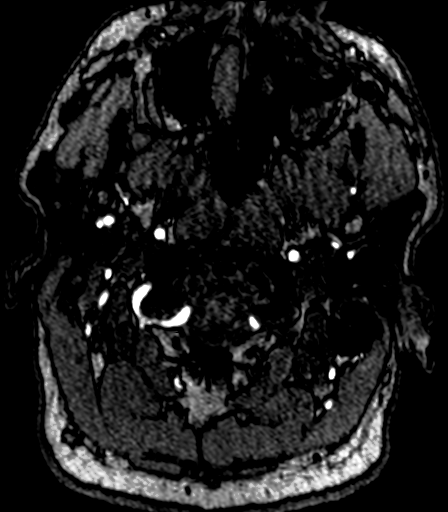
[im 29/143]
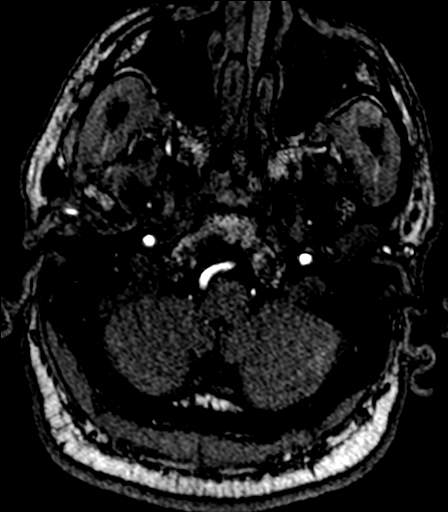
[im 43/143]
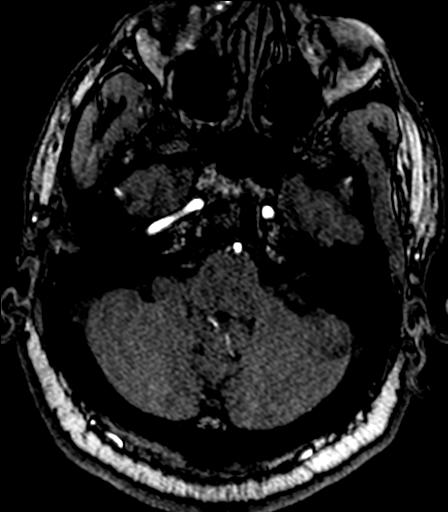

[Series 14: T2 · axial · 5.0mm · 0.45mm/px · z∈[-49,+106]mm · 2 of 25 slices shown (2 of 2)]
[im 1/25]
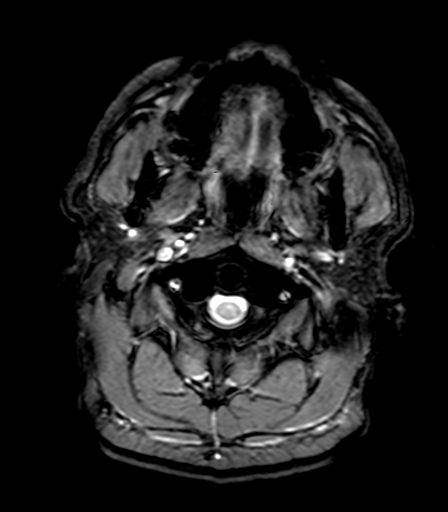
[im 25/25]
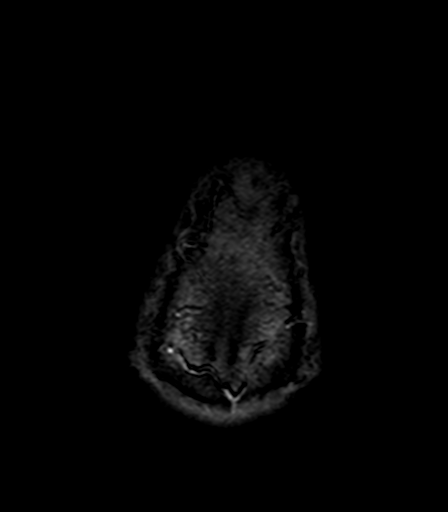

[Series 15: T1 · axial · 3.0mm · 1.00mm/px · z∈[-77,+100]mm · 4 of 60 slices shown (2 of 2)]
[im 1/60]
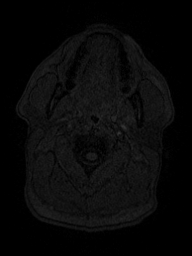
[im 20/60]
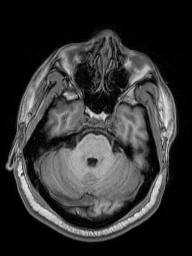
[im 40/60]
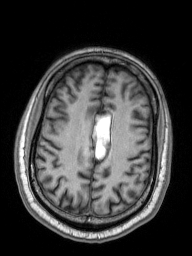
[im 60/60]
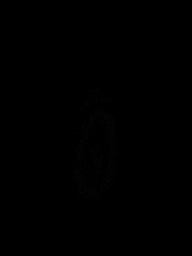

[Series 16: T2 post-contrast · coronal · 5.0mm · 0.49mm/px · 2 of 27 slices shown]
[im 1/27]
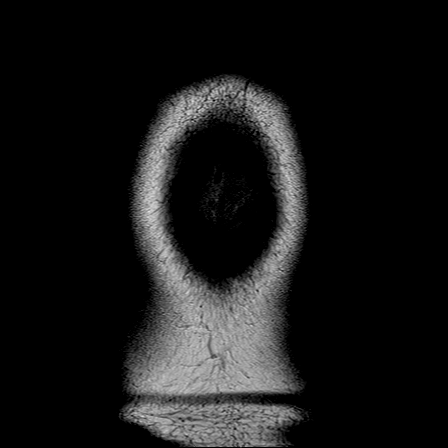
[im 27/27]
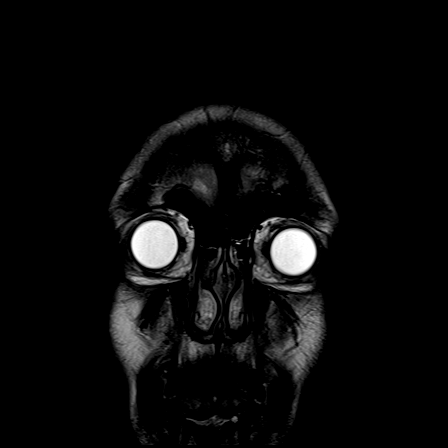

[Series 17: T1 post-contrast · axial · 3.0mm · 1.00mm/px · z∈[-77,+100]mm · 4 of 60 slices shown (1 of 3)]
[im 1/60]
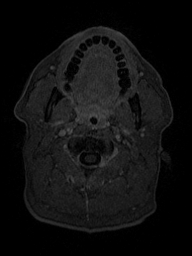
[im 20/60]
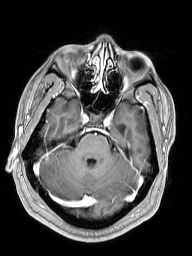
[im 40/60]
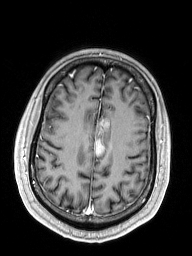
[im 60/60]
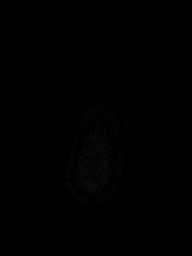

[Series 18: T1 post-contrast · coronal · 5.0mm · 0.43mm/px · 2 of 27 slices shown (2 of 3)]
[im 1/27]
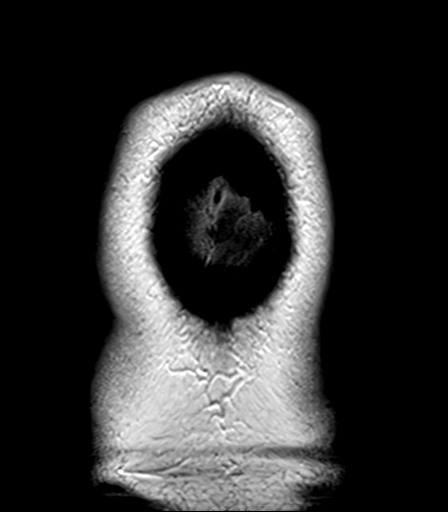
[im 27/27]
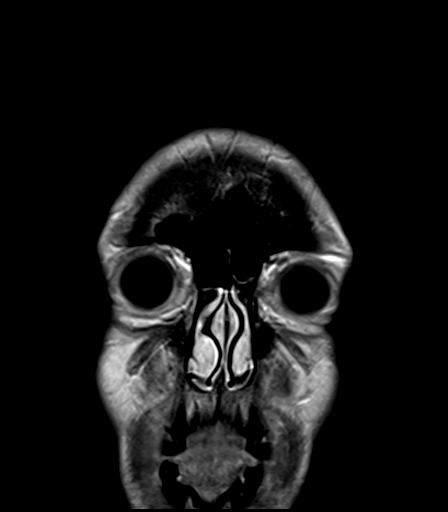

[Series 19: T1 post-contrast · sagittal · 5.0mm · 0.45mm/px · 2 of 25 slices shown (3 of 3)]
[im 1/25]
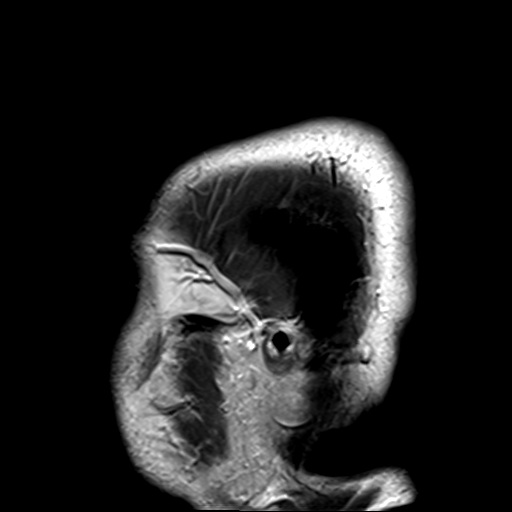
[im 25/25]
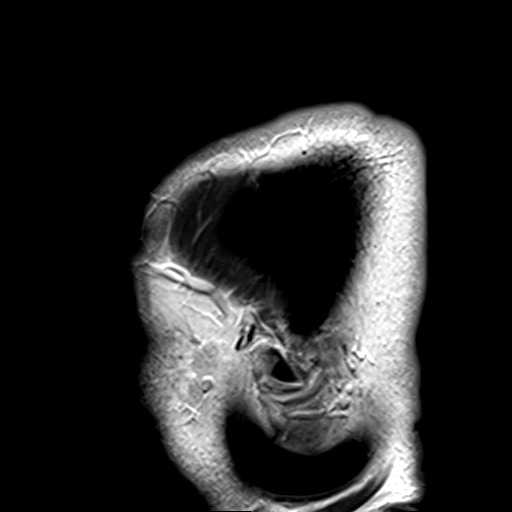

[Series 100: DWI · axial · 3.0mm · 1.80mm/px · z∈[-48,+113]mm · 4 of 55 slices shown (3 of 3)]
[im 1/55]
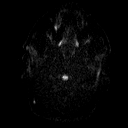
[im 19/55]
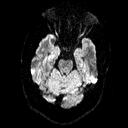
[im 37/55]
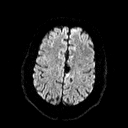
[im 55/55]
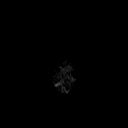

[37 of 48 positions shown; findings below may reference images not displayed]

FINDINGS: MRI HEAD FINDINGS

There is no evidence of acute infarct, intracranial hemorrhage,
intra-axial mass, midline shift, or extra-axial fluid collection.
Cerebral volume is within normal limits for age. Again seen is
dysgenesis of the corpus callosum with an associated large
pericallosal lipoma, unchanged. Lipomatous involvement of the
choroid of both lateral ventricles is also unchanged. A few punctate
foci of T2 hyperintensity in the cerebral white matter are
unchanged, nonspecific, and not greater than expected for patient's
age. No abnormal enhancement is identified.

Orbits are unremarkable. There is mild left maxillary sinus mucosal
thickening. There are moderate right and trace left mastoid
effusions, increased from the prior study. Major intracranial
vascular flow voids are preserved.

MRA HEAD FINDINGS

The visualized distal vertebral arteries are patent with the right
being dominant. PICA, AICA, and SCA origins are patent. Basilar
artery is patent without stenosis. There is likely a small left
posterior communicating artery. PCAs are patent without evidence of
significant stenosis.

Internal carotid arteries are patent from skullbase to carotid
termini. There is mild left greater than right carotid siphon
irregularity with at most mild anterior cavernous narrowing
bilaterally. There is a focal 2 mm laterally directed outpouching
from the anterior cavernous carotid on the left. ACAs and MCAs are
patent with mild branch vessel irregularity but no evidence of
significant proximal stenosis or major branch vessel occlusion.
There is a 1 mm superiorly directed outpouching from the proximal
left M1 segment.
IMPRESSION: 1. No acute intracranial abnormality.
2. Dysgenesis of the corpus callosum with large midline lipoma,
unchanged.
3. Mild intracranial atherosclerotic type change with at most mild
ICA narrowing. No evidence of significant posterior circulation
stenosis.
4. Likely 2 mm left cavernous ICA aneurysm and 1 mm proximal left
MCA aneurysm.

## 2019-11-06 ENCOUNTER — Ambulatory Visit: Payer: Self-pay
